# Patient Record
Sex: Male | Born: 1995 | Race: Black or African American | Hispanic: No | Marital: Single | Smoking: Never smoker
Health system: Southern US, Community
[De-identification: ages and names within clinical notes are randomized; demographics above are authoritative.]

---
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
## 2021-04-09 ENCOUNTER — Encounter (HOSPITAL_COMMUNITY): Payer: Self-pay | Admitting: Emergency Medicine

## 2021-04-09 ENCOUNTER — Other Ambulatory Visit: Payer: Self-pay

## 2021-04-09 ENCOUNTER — Ambulatory Visit (HOSPITAL_COMMUNITY)
Admission: EM | Admit: 2021-04-09 | Discharge: 2021-04-09 | Disposition: A | Discharge status: Home / Self Care | Payer: Self-pay | Attending: Emergency Medicine | Admitting: Emergency Medicine

## 2021-04-09 DIAGNOSIS — U071 COVID-19: Secondary | ICD-10-CM | POA: Insufficient documentation

## 2021-04-09 DIAGNOSIS — M542 Cervicalgia: Secondary | ICD-10-CM | POA: Insufficient documentation

## 2021-04-09 LAB — SARS CORONAVIRUS 2 (TAT 6-24 HRS): SARS Coronavirus 2: POSITIVE — AB

## 2021-04-09 NOTE — ED Provider Notes (Signed)
[facility]    CSN: [id number] Arrival date & time: 04/09/21  1205      History   Chief Complaint Chief Complaint  Patient presents with  . Neck Pain  . Cough  . Nasal Congestion  . Fever    HPI Glen Hardy is a 25 y.o. male.   Patient here for evaluation of neck pain, cough, nasal congestion, and fever that have been ongoing for the past 2 to 3 days.  Reports having a fever as high as 100-101 at home.  Temperature 98.4 in office.  Reports neck pain and stiffness has been worsening over the past several days and is now unable to move head or neck.  Has not taken any OTC medications or treatments.  Reports at home COVID test was positive this am.  Denies any trauma, injury, or other precipitating event.  Denies any specific alleviating or aggravating factors.  Denies any chest pain, shortness of breath, N/V/D, numbness, tingling, weakness, abdominal pain, or headaches.     The history is provided by the patient.    History reviewed. No pertinent past medical history.  There are no problems to display for this patient.   History reviewed. No pertinent surgical history.     Home Medications    Prior to Admission medications   Not on File    Family History History reviewed. No pertinent family history.  Social History Social History   Tobacco Use  . Smoking status: Never Smoker  . Smokeless tobacco: Never Used  Vaping Use  . Vaping Use: Never used  Substance Use Topics  . Alcohol use: Never  . Drug use: Never     Allergies   Patient has no known allergies.   Review of Systems Review of Systems  Constitutional: Positive for fever.  HENT: Positive for congestion.   Respiratory: Positive for cough.   Musculoskeletal: Positive for neck pain and neck stiffness.  All other systems reviewed and are negative.    Physical Exam Triage Vital Signs ED Triage Vitals  Enc Vitals Group     BP 04/09/21 1334 132/75     Pulse Rate  04/09/21 1334 77     Resp 04/09/21 1334 18     Temp 04/09/21 1334 98.4 F (36.9 C)     Temp Source 04/09/21 1334 Oral     SpO2 04/09/21 1334 100 %     Weight 04/09/21 1333 165 lb (74.8 kg)     Height 04/09/21 1333 5\' 10"  (1.778 m)     Head Circumference --      Peak Flow --      Pain Score 04/09/21 1332 7     Pain Loc --      Pain Edu? --      Excl. in GC? --    No data found.  Updated Vital Signs BP 132/75 (BP Location: Right Arm)   Pulse 77   Temp 98.4 F (36.9 C) (Oral)   Resp 18   Ht 5\' 10"  (1.778 m)   Wt 165 lb (74.8 kg)   SpO2 100%   BMI 23.68 kg/m   Visual Acuity Right Eye Distance:   Left Eye Distance:   Bilateral Distance:    Right Eye Near:   Left Eye Near:    Bilateral Near:     Physical Exam Vitals and nursing note reviewed.  Constitutional:      General: He is not in acute distress.    Appearance: Normal appearance. He is not  ill-appearing, toxic-appearing or diaphoretic.  HENT:     Head: Normocephalic and atraumatic.  Eyes:     Conjunctiva/sclera: Conjunctivae normal.  Cardiovascular:     Rate and Rhythm: Normal rate.     Pulses: Normal pulses.  Pulmonary:     Effort: Pulmonary effort is normal.  Abdominal:     General: Abdomen is flat.  Musculoskeletal:     Cervical back: Rigidity and torticollis present. No swelling, deformity, erythema, tenderness, bony tenderness or crepitus. Pain with movement present. Decreased range of motion.  Skin:    General: Skin is warm and dry.  Neurological:     General: No focal deficit present.     Mental Status: He is alert and oriented to person, place, and time.  Psychiatric:        Mood and Affect: Mood normal.      UC Treatments / Results  Labs (all labs ordered are listed, but only abnormal results are displayed) Labs Reviewed  SARS CORONAVIRUS 2 (TAT 6-24 HRS)    EKG   Radiology No results found.  Procedures Procedures (including critical care time)  Medications Ordered in  UC Medications - No data to display  Initial Impression / Assessment and Plan / UC Course  I have reviewed the triage vital signs and the nursing notes.  Pertinent labs & imaging results that were available during my care of the patient were reviewed by me and considered in my medical decision making (see chart for details).    Assessment negative for red flags or concerns.  Viral meningitis unlikely as patient as been symptomatic for 2-3 days and is afebrile in office.  However, patient given strict ED follow up for worsening symptoms, including elevated fever, neck pain/stiffness, N/V, and altered mental status.  Will treat neck pain and stiffness with a one time dose of Ibuprofen and benadryl.  Instructed patient to take Benadryl and Ibuprofen once he gets home.  COVID test pending.  Work note supplied.  Final Clinical Impressions(s) / UC Diagnoses   Final diagnoses:  Neck pain  COVID-19     Discharge Instructions     When you get home, take Ibuprofen 800 mg (4 pills) and Benadryl 50mg  (2 pills).  Then rest. This should help relieve some of your neck pain.    If your symptoms get worse, including worse neck pain and stiffness, severe headache, vision changes, or a fever, go directly to the Emergency Department for evaluation.      ED Prescriptions    None     PDMP not reviewed this encounter.   , NP 04/09/21 1425

## 2021-04-09 NOTE — Discharge Instructions (Signed)
When you get home, take Ibuprofen 800 mg (4 pills) and Benadryl 50mg  (2 pills).  Then rest. This should help relieve some of your neck pain.    If your symptoms get worse, including worse neck pain and stiffness, severe headache, vision changes, or a fever, go directly to the Emergency Department for evaluation.

## 2021-04-09 NOTE — ED Triage Notes (Signed)
Pt presents today with c/o of cough, nasal congestion and low grade temp 2-3 days. He also c/o of right sided neck pain 2 days. Denies injury. He did report testing positive for Covid in home test this morning.

## 2022-01-15 ENCOUNTER — Emergency Department (HOSPITAL_COMMUNITY)
Admission: EM | Admit: 2022-01-15 | Discharge: 2022-01-15 | Disposition: A | Discharge status: Home / Self Care | Payer: 59 | Attending: Emergency Medicine | Admitting: Emergency Medicine

## 2022-01-15 ENCOUNTER — Other Ambulatory Visit: Payer: Self-pay

## 2022-01-15 ENCOUNTER — Encounter (HOSPITAL_COMMUNITY): Payer: Self-pay | Admitting: Emergency Medicine

## 2022-01-15 DIAGNOSIS — R040 Epistaxis: Secondary | ICD-10-CM | POA: Insufficient documentation

## 2022-01-15 NOTE — ED Provider Notes (Signed)
[facility] EMERGENCY DEPARTMENT Provider Note   CSN: [id number] Arrival date & time: 01/15/22  0359     History  Chief Complaint  Patient presents with   Epistaxis    Glen Hardy is a 26 y.o. male.  26 yo M with a cc of a nose bleed.  Going on for about 20 min or so.  Resolved spontaneously.  Held his head back and felt like blood went down the back of his throat.  Felt like he was going to pass out.  Now has resolved.  Denies trauma.    Epistaxis     Home Medications Prior to Admission medications   Not on File      Allergies    Patient has no known allergies.    Review of Systems   Review of Systems  HENT:  Positive for nosebleeds.    Physical Exam Updated Vital Signs BP (!) 137/95 (BP Location: Right Arm)    Pulse 87    Temp 98.1 F (36.7 C) (Oral)    Resp 20    Ht 5\' 10"  (1.778 m)    Wt 103 kg    SpO2 99%    BMI 32.58 kg/m  Physical Exam Vitals and nursing note reviewed.  Constitutional:      Appearance: He is well-developed.  HENT:     Head: Normocephalic and atraumatic.  Eyes:     Pupils: Pupils are equal, round, and reactive to light.  Neck:     Vascular: No JVD.  Cardiovascular:     Rate and Rhythm: Normal rate and regular rhythm.     Heart sounds: No murmur heard.   No friction rub. No gallop.  Pulmonary:     Effort: No respiratory distress.     Breath sounds: No wheezing.  Abdominal:     General: There is no distension.     Tenderness: There is no abdominal tenderness. There is no guarding or rebound.  Musculoskeletal:        General: Normal range of motion.     Cervical back: Normal range of motion and neck supple.  Skin:    Coloration: Skin is not pale.     Findings: No rash.  Neurological:     Mental Status: He is alert and oriented to person, place, and time.  Psychiatric:        Behavior: Behavior normal.    ED Results / Procedures / Treatments   Labs (all labs ordered are listed, but only  abnormal results are displayed) Labs Reviewed - No data to display  EKG EKG Interpretation  Date/Time:  Thursday January 15 2022 04:45:01 EST Ventricular Rate:  80 PR Interval:  176 QRS Duration: 90 QT Interval:  382 QTC Calculation: 441 R Axis:   26 Text Interpretation: Sinus rhythm no wpw, prolonged qt or brugada, No old tracing to compare Confirmed by 1968 [phone]) on 01/15/2022 4:47:04 AM  Radiology No results found.  Procedures Procedures    Medications Ordered in ED Medications - No data to display  ED Course/ Medical Decision Making/ A&P                           Medical Decision Making Amount and/or Complexity of Data Reviewed ECG/medicine tests: ordered.   26 year old male with a chief complaint of a nosebleed.  Started while he was at work and has since resolved.  The patient when he was having the nosebleed had tilted  his head back and had blood go down the back of his throat and felt he was going to pass out.  This is now resolved as well.  No signs of continued bleeding on exam.  We will obtain an EKG with his reported sensation of feeling like he might pass out then more likely he had a vasovagal reaction with a nosebleed.  EKG without concerning finding.  D/c home.   4:47 AM:  I have discussed the diagnosis/risks/treatment options with the patient.  Evaluation and diagnostic testing in the emergency department does not suggest an emergent condition requiring admission or immediate intervention beyond what has been performed at this time.  They will follow up with  PCP. We also discussed returning to the ED immediately if new or worsening sx occur. We discussed the sx which are most concerning (e.g., sudden worsening pain, fever, inability to tolerate by mouth) that necessitate immediate return. Medications administered to the patient during their visit and any new prescriptions provided to the patient are listed below.  Medications given during this  visit Medications - No data to display   The patient appears reasonably screen and/or stabilized for discharge and I doubt any other medical condition or other [facility] requiring further screening, evaluation, or treatment in the ED at this time prior to discharge.          Final Clinical Impression(s) / ED Diagnoses Final diagnoses:  Right-sided epistaxis    Rx / DC Orders ED Discharge Orders     None         Melene Plan, DO 01/15/22 [phone]

## 2022-01-15 NOTE — Discharge Instructions (Signed)
Put a humidifier in your room.  Put a small amount of ointment on the tip of your finger and he can apply just on the inside of the nose.  If you have recurrent bleeding then hold pressure for 15 minutes without stopping to look.

## 2022-01-15 NOTE — ED Triage Notes (Signed)
Patient reports brief right epistaxis while at work this morning , he added feeling lightheaded/dizzy, denies nasal injury , no bleeding at arrival .
